# Patient Record
Sex: Male | Born: 1985 | Race: Black or African American | Hispanic: No | Marital: Married | Smoking: Never smoker
Health system: Southern US, Community
[De-identification: ages and names within clinical notes are randomized; demographics above are authoritative.]

## PROBLEM LIST (undated history)

## (undated) DIAGNOSIS — A048 Other specified bacterial intestinal infections: Secondary | ICD-10-CM

## (undated) DIAGNOSIS — M238X9 Other internal derangements of unspecified knee: Secondary | ICD-10-CM

## (undated) DIAGNOSIS — S83519A Sprain of anterior cruciate ligament of unspecified knee, initial encounter: Secondary | ICD-10-CM

## (undated) DIAGNOSIS — S83529A Sprain of posterior cruciate ligament of unspecified knee, initial encounter: Secondary | ICD-10-CM

## (undated) HISTORY — DX: Other specified bacterial intestinal infections: A04.8

## (undated) HISTORY — DX: Sprain of posterior cruciate ligament of unspecified knee, initial encounter: S83.529A

## (undated) HISTORY — DX: Other internal derangements of unspecified knee: M23.8X9

## (undated) HISTORY — DX: Sprain of anterior cruciate ligament of unspecified knee, initial encounter: S83.519A

---
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
## 2005-11-20 ENCOUNTER — Emergency Department (HOSPITAL_COMMUNITY): Admission: EM | Admit: 2005-11-20 | Discharge: 2005-11-20 | Payer: Self-pay | Admitting: Emergency Medicine

## 2005-11-20 ENCOUNTER — Ambulatory Visit (HOSPITAL_COMMUNITY): Admission: RE | Admit: 2005-11-20 | Discharge: 2005-11-20 | Payer: Self-pay | Admitting: Preventative Medicine

## 2007-08-27 ENCOUNTER — Emergency Department (HOSPITAL_COMMUNITY): Admission: EM | Admit: 2007-08-27 | Discharge: 2007-08-28 | Payer: Self-pay | Admitting: Emergency Medicine

## 2010-06-30 ENCOUNTER — Emergency Department (HOSPITAL_BASED_OUTPATIENT_CLINIC_OR_DEPARTMENT_OTHER): Admission: EM | Admit: 2010-06-30 | Discharge: 2010-06-30 | Payer: Self-pay | Admitting: Emergency Medicine

## 2010-06-30 ENCOUNTER — Ambulatory Visit: Payer: Self-pay | Admitting: Diagnostic Radiology

## 2011-09-13 ENCOUNTER — Ambulatory Visit (INDEPENDENT_AMBULATORY_CARE_PROVIDER_SITE_OTHER): Payer: Managed Care, Other (non HMO)

## 2011-09-13 DIAGNOSIS — J029 Acute pharyngitis, unspecified: Secondary | ICD-10-CM

## 2011-09-13 DIAGNOSIS — J040 Acute laryngitis: Secondary | ICD-10-CM

## 2011-10-17 ENCOUNTER — Encounter: Payer: Self-pay | Admitting: Emergency Medicine

## 2011-11-14 ENCOUNTER — Ambulatory Visit (INDEPENDENT_AMBULATORY_CARE_PROVIDER_SITE_OTHER): Payer: Managed Care, Other (non HMO) | Admitting: Family Medicine

## 2011-11-14 ENCOUNTER — Ambulatory Visit: Payer: Managed Care, Other (non HMO)

## 2011-11-14 VITALS — BP 124/77 | HR 74 | Temp 98.0°F | Resp 16 | Ht 72.0 in | Wt 264.0 lb

## 2011-11-14 DIAGNOSIS — M25562 Pain in left knee: Secondary | ICD-10-CM

## 2011-11-14 DIAGNOSIS — IMO0002 Reserved for concepts with insufficient information to code with codable children: Secondary | ICD-10-CM

## 2011-11-14 MED ORDER — NAPROXEN 500 MG PO TABS: 500.0000 mg | ORAL_TABLET | Freq: Two times a day (BID) | ORAL | Status: DC

## 2011-11-14 NOTE — Progress Notes (Signed)
  [facility]:  Office Visit  Chief Complaint:  Chief Complaint  Patient presents with  . Knee Pain    left x 1 week, nki    HPI: Corey Howell. is a 26 y.o. male who complains of  1 week h/o left knee pain with weightbearing with stairs going up and down. 5-10/10 sharp pain when it occurs. Tried ice and NSAIDs without relief. No injury. + buckling. Denies clincking, popping.He is athletic and has been doing cross training. Denies abx or steroid use.    Past Medical History  Diagnosis Date  . Unstable MCL   . Tear of PCL (posterior cruciate ligament) of knee   . ACL injury tear   . H. pylori infection    Past Surgical History  Procedure Date  . Right knee     Delbert Harness  2003-PCL, ACL, MCL, LCL repair   History   Social History  . Marital Status: Single    Spouse Name: N/A    Number of Children: N/A  . Years of Education: N/A   Social History Main Topics  . Smoking status: Never Smoker   . Smokeless tobacco: None  . Alcohol Use: No  . Drug Use: No  . Sexually Active: None   Other Topics Concern  . None   Social History Narrative  . None   Family History  Problem Relation Age of Onset  . Diabetes Mother   . Hypertension Father    No Known Allergies Prior to Admission medications   Not on File     ROS: The patient denies fevers, chills, night sweats, unintentional weight loss, chest pain, palpitations, wheezing, dyspnea on exertion, nausea, vomiting, abdominal pain, dysuria, hematuria, melena, numbness, weakness, or tingling. + knee pain  All other systems have been reviewed and were otherwise negative with the exception of those mentioned in the HPI and as above.    PHYSICAL EXAM: Filed Vitals:   11/14/11 1249  BP: 124/77  Pulse: 74  Temp: 98 F (36.7 C)  Resp: 16   Filed Vitals:   11/14/11 1249  Height: 6' (1.829 m)  Weight: 264 lb (119.75 kg)   Body mass index is 35.80 kg/(m^2).  General: Alert, no acute  distress HEENT:  Normocephalic, atraumatic, oropharynx patent.  Cardiovascular:  Regular rate and rhythm, no rubs murmurs or gallops.  No Carotid bruits, radial pulse intact. No pedal edema.  Respiratory: Clear to auscultation bilaterally.  No wheezes, rales, or rhonchi.  No cyanosis, no use of accessory musculature GI: No organomegaly, abdomen is soft and non-tender, positive bowel sounds.  No masses. Skin: No rashes. Neurologic: Facial musculature symmetric. Psychiatric: Patient is appropriate throughout our interaction. Lymphatic: No cervical lymphadenopathy Musculoskeletal: Gait intact. Left knee: no erythema, swelling, full PROM/AROM, 5/5 strength, sensation intact, 2/2 DTR knee and ankle, no crepitus, + MCL and LCL tenderness, negative  McMurray, negative  Lachman   LABS: No results found for this or any previous visit.   EKG/XRAY:   Primary read interpreted by Dr. Conley Rolls at [facility]. Normal Left knee   ASSESSMENT/PLAN: Encounter Diagnoses  Name Primary?  . Knee pain, left Yes  . Knee sprain and strain     1. Knee Brace 2. No work restrictions, decrease running, squatting until pain decreases, advance activity as tolerated 3. Naproxen BID 4. Tylenol prn   Richerd Grime PHUONG, DO 11/14/2011 1:54 PM

## 2011-11-14 NOTE — Progress Notes (Deleted)
  Subjective:    Patient ID: Corey Howell., male    DOB: [birth year], 26 y.o.   MRN: [id number]  HPI    Review of Systems     Objective:   Physical Exam        Assessment & Plan:

## 2011-12-28 ENCOUNTER — Ambulatory Visit (INDEPENDENT_AMBULATORY_CARE_PROVIDER_SITE_OTHER): Payer: Managed Care, Other (non HMO) | Admitting: Internal Medicine

## 2011-12-28 VITALS — BP 125/75 | HR 67 | Temp 97.9°F | Resp 16 | Ht 74.0 in | Wt 265.0 lb

## 2011-12-28 DIAGNOSIS — J019 Acute sinusitis, unspecified: Secondary | ICD-10-CM

## 2011-12-28 DIAGNOSIS — J309 Allergic rhinitis, unspecified: Secondary | ICD-10-CM

## 2011-12-28 MED ORDER — AMOXICILLIN 500 MG PO CAPS: 1000.0000 mg | ORAL_CAPSULE | Freq: Three times a day (TID) | ORAL | Status: AC

## 2011-12-28 MED ORDER — FLUTICASONE PROPIONATE 50 MCG/ACT NA SUSP: 2.0000 | Freq: Every day | NASAL | Status: DC

## 2011-12-28 NOTE — Progress Notes (Signed)
  Subjective:    Patient ID: Corey Howell., male    DOB: September 05, 1985, 26 y.o.   MRN: [id number]  HPICongestive x3-4 weeks with sneezing and eyes watering Increased cough particularly at night for 5 days Headache with pressure behind his and purulent nasal discharge x2 days No fever   has his first baby on the way in 10 day Review of Systems     Objective:   Physical Exam TMs clear Sinuses tender to percussion Purulent nasal discharge Throat clear Lungs clear       Assessment & Plan:   Acute sinusitis Secondary to allergic rhinitis Amoxicillin 1 g twice a day for 10 days Flonase 2 sprays each nostril at bedtime for one month

## 2012-03-28 ENCOUNTER — Ambulatory Visit: Payer: Managed Care, Other (non HMO)

## 2012-03-28 ENCOUNTER — Ambulatory Visit (INDEPENDENT_AMBULATORY_CARE_PROVIDER_SITE_OTHER): Payer: Managed Care, Other (non HMO) | Admitting: Family Medicine

## 2012-03-28 VITALS — BP 120/70 | HR 69 | Temp 98.2°F | Resp 16 | Ht 72.5 in | Wt 260.0 lb

## 2012-03-28 DIAGNOSIS — M79606 Pain in leg, unspecified: Secondary | ICD-10-CM

## 2012-03-28 DIAGNOSIS — M79609 Pain in unspecified limb: Secondary | ICD-10-CM

## 2012-03-28 MED ORDER — MELOXICAM 7.5 MG PO TABS: 7.5000 mg | ORAL_TABLET | Freq: Every day | ORAL | Status: DC

## 2012-03-28 NOTE — Progress Notes (Signed)
26 yo married man with 67 month old girl comes in with left leg pain and swelling after trauma on Thursday, 3 days ago, when struck by pallet jack at [employer]   Objective:  Left lateral lower leg just above the ankle is swollen Patient able to bear weight Good foot ROM, including dorsiflexion and eversion [facility] reading (PRIMARY) by  Dr. Milus Glazier:  Neg for fx.    Assessment:  Contusion left lower leg without neuro-vasc complication  Plan:  ACE meloxicam

## 2012-08-15 ENCOUNTER — Ambulatory Visit (INDEPENDENT_AMBULATORY_CARE_PROVIDER_SITE_OTHER): Payer: Managed Care, Other (non HMO) | Admitting: Emergency Medicine

## 2012-08-15 VITALS — BP 125/80 | HR 76 | Temp 98.7°F | Resp 16 | Ht 73.5 in | Wt 278.0 lb

## 2012-08-15 DIAGNOSIS — R05 Cough: Secondary | ICD-10-CM

## 2012-08-15 DIAGNOSIS — J209 Acute bronchitis, unspecified: Secondary | ICD-10-CM

## 2012-08-15 LAB — POCT INFLUENZA A/B
Influenza A, POC: NEGATIVE
Influenza B, POC: NEGATIVE

## 2012-08-15 MED ORDER — HYDROCOD POLST-CHLORPHEN POLST 10-8 MG/5ML PO LQCR: 5.0000 mL | Freq: Two times a day (BID) | ORAL | Status: DC | PRN

## 2012-08-15 MED ORDER — AZITHROMYCIN 250 MG PO TABS: ORAL_TABLET | ORAL | Status: DC

## 2012-08-15 MED ORDER — PSEUDOEPHEDRINE-GUAIFENESIN ER 60-600 MG PO TB12: 1.0000 | ORAL_TABLET | Freq: Two times a day (BID) | ORAL | Status: DC

## 2012-08-15 NOTE — Progress Notes (Signed)
[facility] [address], [place] [address] [phone]  Date:  08/15/2012   Name:  Corey Howell.   DOB:  [birth year]   MRN:  [id number]  PCP:  No primary provider on file.    Chief Complaint: Sore Throat, Cough and Nausea   History of Present Illness:  Corey Lamp. is a 26 y.o. very pleasant male patient who presents with the following:  Sudden onset of illness Thursday with sore throat, nasal congestion and cough productive mucoid sputum.  No wheezing or shortness of breath.  No nausea or vomiting.  No improvement with OTC medication.  No fever or chills.  Has malaise.  Sick kids at home.  No flu shot  There is no problem list on file for this patient.   Past Medical History  Diagnosis Date  . Unstable MCL   . Tear of PCL (posterior cruciate ligament) of knee   . ACL injury tear   . H. pylori infection     Past Surgical History  Procedure Date  . Right knee     Delbert Harness  2003-PCL, ACL, MCL, LCL repair  . Joint replacement     History  Substance Use Topics  . Smoking status: Never Smoker   . Smokeless tobacco: Not on file  . Alcohol Use: Yes    Family History  Problem Relation Age of Onset  . Diabetes Mother   . Hypertension Father     No Known Allergies  Medication list has been reviewed and updated.  Current Outpatient Prescriptions on File Prior to Visit  Medication Sig Dispense Refill  . fluticasone (FLONASE) 50 MCG/ACT nasal spray Place 2 sprays into the nose daily.  16 g  6  . meloxicam (MOBIC) 7.5 MG tablet Take 1 tablet (7.5 mg total) by mouth daily.  10 tablet  0  . naproxen (NAPROSYN) 500 MG tablet Take 1 tablet (500 mg total) by mouth 2 (two) times daily with a meal.  60 tablet  1    Review of Systems:  As per HPI, otherwise negative.    Physical Examination: Filed Vitals:   08/15/12 0818  BP: 125/80  Pulse: 76  Temp: 98.7 F (37.1 C)  Resp: 16   Filed Vitals:   08/15/12 0818  Height: 6'  1.5" (1.867 m)  Weight: 278 lb (126.1 kg)   Body mass index is 36.18 kg/(m^2). Ideal Body Weight: Weight in (lb) to have BMI = 25: 191.7   GEN: WDWN, NAD, Non-toxic, A & O x 3 HEENT: Atraumatic, Normocephalic. Neck supple. No masses, No LAD. Ears and Nose: No external deformity. CV: RRR, No M/G/R. No JVD. No thrill. No extra heart sounds. PULM: CTA B, no wheezes, crackles, rhonchi. No retractions. No resp. distress. No accessory muscle use. ABD: S, NT, ND, +BS. No rebound. No HSM. EXTR: No c/c/e NEURO Normal gait.  PSYCH: Normally interactive. Conversant. Not depressed or anxious appearing.  Calm demeanor.    Assessment and Plan: Bronchitis tussionex  mucinex zpak  Carmelina Dane, MD Results for orders placed in visit on 08/15/12  POCT INFLUENZA A/B      Component Value Range   Influenza A, POC Negative     Influenza B, POC Negative

## 2012-08-15 NOTE — Patient Instructions (Addendum)

## 2012-09-18 ENCOUNTER — Encounter (HOSPITAL_BASED_OUTPATIENT_CLINIC_OR_DEPARTMENT_OTHER): Payer: Self-pay | Admitting: Emergency Medicine

## 2012-09-18 ENCOUNTER — Emergency Department (HOSPITAL_BASED_OUTPATIENT_CLINIC_OR_DEPARTMENT_OTHER): Payer: Worker's Compensation

## 2012-09-18 ENCOUNTER — Emergency Department (HOSPITAL_BASED_OUTPATIENT_CLINIC_OR_DEPARTMENT_OTHER)
Admission: EM | Admit: 2012-09-18 | Discharge: 2012-09-18 | Disposition: A | Discharge status: Home / Self Care | Payer: Worker's Compensation | Attending: Emergency Medicine | Admitting: Emergency Medicine

## 2012-09-18 DIAGNOSIS — Z87828 Personal history of other (healed) physical injury and trauma: Secondary | ICD-10-CM | POA: Insufficient documentation

## 2012-09-18 DIAGNOSIS — Y929 Unspecified place or not applicable: Secondary | ICD-10-CM | POA: Insufficient documentation

## 2012-09-18 DIAGNOSIS — S93409A Sprain of unspecified ligament of unspecified ankle, initial encounter: Secondary | ICD-10-CM

## 2012-09-18 DIAGNOSIS — X500XXA Overexertion from strenuous movement or load, initial encounter: Secondary | ICD-10-CM | POA: Insufficient documentation

## 2012-09-18 DIAGNOSIS — Y939 Activity, unspecified: Secondary | ICD-10-CM | POA: Insufficient documentation

## 2012-09-18 DIAGNOSIS — Z8619 Personal history of other infectious and parasitic diseases: Secondary | ICD-10-CM | POA: Insufficient documentation

## 2012-09-18 MED ORDER — NAPROXEN 500 MG PO TABS: 500.0000 mg | ORAL_TABLET | Freq: Two times a day (BID) | ORAL | Status: DC

## 2012-09-18 NOTE — ED Notes (Signed)
Pt slipped and twisted his left ankle.  C/o pain to same.  Able to bear weight.

## 2012-09-18 NOTE — ED Provider Notes (Signed)
History     SN: [id number] Arrival date & time 09/18/12  1218 First MD Initiated Contact with Patient 09/18/12 1226      Chief Complaint  Patient presents with  . Ankle Injury    Patient is a 27 y.o. male presenting with lower extremity injury. The history is provided by the patient.  Ankle Injury This is a new problem. The current episode started yesterday. The problem occurs constantly. The problem has not changed since onset.Pertinent negatives include no abdominal pain and no headaches. Associated symptoms comments: No numbness or weakness. The symptoms are aggravated by walking. The symptoms are relieved by rest.    Past Medical History  Diagnosis Date  . Unstable MCL   . Tear of PCL (posterior cruciate ligament) of knee   . ACL injury tear   . H. pylori infection     Past Surgical History  Procedure Date  . Right knee     Delbert Harness  2003-PCL, ACL, MCL, LCL repair  . Joint replacement     Family History  Problem Relation Age of Onset  . Diabetes Mother   . Hypertension Father     History  Substance Use Topics  . Smoking status: Never Smoker   . Smokeless tobacco: Not on file  . Alcohol Use: Yes      Review of Systems  Gastrointestinal: Negative for abdominal pain.  Neurological: Negative for headaches.  All other systems reviewed and are negative.    Allergies  Review of patient's allergies indicates no known allergies.  Home Medications  No current outpatient prescriptions on file.  There were no vitals taken for this visit.  Physical Exam  Nursing note and vitals reviewed. Constitutional: He appears well-developed and well-nourished. No distress.  HENT:  Head: Normocephalic and atraumatic.  Right Ear: External ear normal.  Left Ear: External ear normal.  Eyes: Conjunctivae normal are normal. Right eye exhibits no discharge. Left eye exhibits no discharge. No scleral icterus.  Neck: Neck supple. No tracheal deviation present.   Cardiovascular: Normal rate.   Pulmonary/Chest: Effort normal. No stridor. No respiratory distress.  Musculoskeletal: He exhibits no edema.       Left ankle: He exhibits decreased range of motion, swelling and deformity. He exhibits no ecchymosis, no laceration and normal pulse. tenderness. Lateral malleolus tenderness found. No medial malleolus, no head of 5th metatarsal and no proximal fibula tenderness found. Achilles tendon normal.  Neurological: He is alert. Cranial nerve deficit: no gross deficits.  Skin: Skin is warm and dry. No rash noted.  Psychiatric: He has a normal mood and affect.    ED Course  Procedures (including critical care time)  Labs Reviewed - No data to display Dg Ankle Complete Left  09/18/2012  *RADIOLOGY REPORT*  Clinical Data: Twisted left ankle, now with generalized ankle pain  LEFT ANKLE COMPLETE - 3+ VIEW  Comparison: Left tibia and fibula - 03/18/2012  Findings:  There is mild diffuse soft tissue swelling about the anterolateral aspect of the ankle without associated fracture or dislocation. Query small ankle joint effusion.  Incidental note is made of a prominent os trigonum.  No radiopaque foreign body.  IMPRESSION: Soft tissue swelling about the anterolateral aspect of the ankle without associated fracture or dislocation.   Original Report Authenticated By: Tacey Ruiz, MD      1. Ankle sprain       MDM  C/w ankle sprain.  No sign of fracture.  ASO splint. Refer to sports med  Celene Kras, MD 09/18/12 [phone]

## 2012-11-29 ENCOUNTER — Ambulatory Visit (INDEPENDENT_AMBULATORY_CARE_PROVIDER_SITE_OTHER): Payer: Managed Care, Other (non HMO) | Admitting: Physician Assistant

## 2012-11-29 VITALS — BP 132/84 | HR 70 | Temp 98.0°F | Resp 18 | Ht 72.0 in | Wt 260.0 lb

## 2012-11-29 DIAGNOSIS — J029 Acute pharyngitis, unspecified: Secondary | ICD-10-CM

## 2012-11-29 DIAGNOSIS — R05 Cough: Secondary | ICD-10-CM

## 2012-11-29 MED ORDER — BENZONATATE 100 MG PO CAPS: 100.0000 mg | ORAL_CAPSULE | Freq: Three times a day (TID) | ORAL | Status: DC | PRN

## 2012-11-29 MED ORDER — IPRATROPIUM BROMIDE 0.03 % NA SOLN: 2.0000 | Freq: Two times a day (BID) | NASAL | Status: DC

## 2012-11-29 MED ORDER — GUAIFENESIN ER 1200 MG PO TB12: 1.0000 | ORAL_TABLET | Freq: Two times a day (BID) | ORAL | Status: DC | PRN

## 2012-11-29 NOTE — Patient Instructions (Signed)
Get plenty of rest and drink at least 64 ounces of water daily. 

## 2012-11-29 NOTE — Progress Notes (Signed)
  Subjective:    Patient ID: Corey Redden., male    DOB: [birth year], 27 y.o.   MRN: [id number]  HPI  This 27 y.o. male presents for evaluation of illness.  Has had a sore throat for about a week.  Then developed cough about 3 days ago.  Cough is productive of green phlegm.  No nasal congestion or post-nasal drainage.  Temp 99 yesterday.  No chills.  No unexplained myalgias/arthralgia.  Diarrhea yesterday.  No nausea or vomiting.   Past Medical History  Diagnosis Date  . Unstable MCL   . Tear of PCL (posterior cruciate ligament) of knee   . ACL injury tear   . H. pylori infection     Past Surgical History  Procedure Laterality Date  . Knee reconstruction Right 2003    Sheryle Spray, ACL, MCL, LCL repair    Prior to Admission medications   Not on File    No Known Allergies  History   Social History  . Marital Status: Married    Spouse Name: Corey Howell    Number of Children: 1  . Years of Education: 12   Occupational History  . grocery selector [employer]   Social History Main Topics  . Smoking status: Never Smoker   . Smokeless tobacco: Never Used  . Alcohol Use: 1.2 oz/week    2 Glasses of wine per week  . Drug Use: No  . Sexually Active: Yes -- Male partner(s)    Birth Control/ Protection: Condom   Other Topics Concern  . Not on file   Social History Narrative   Lives with his wife and their daughter.    Family History  Problem Relation Age of Onset  . Diabetes Mother   . Hypertension Father     Review of Systems As above. No chest pain, SOB, HA, dizziness, vision change, dysuria, urinary urgency or frequency, or rash.     Objective:   Physical Exam  Blood pressure 132/84, pulse 70, temperature 98 F (36.7 C), temperature source Oral, resp. rate 18, height 6' (1.829 m), weight 260 lb (117.935 kg), SpO2 99.00%. Body mass index is 35.25 kg/(m^2). Well-developed, well nourished BM who is awake, alert and oriented, in NAD. HEENT: [place]/AT,  PERRL, EOMI.  Sclera and conjunctiva are clear.  EAC are patent, TMs are normal in appearance. Nasal mucosa is pink and moist. OP is clear. Neck: supple, non-tender, no lymphadenopathy, thyromegaly. Heart: RRR, no murmur Lungs: normal effort, CTA Extremities: no cyanosis, clubbing or edema. Skin: warm and dry without rash. Psychologic: good mood and appropriate affect, normal speech and behavior.     Assessment & Plan:  Cough - Plan: benzonatate (TESSALON) 100 MG capsule  Acute pharyngitis - Plan: ipratropium (ATROVENT) 0.03 % nasal spray, Guaifenesin (MUCINEX MAXIMUM STRENGTH) 1200 MG TB12  Supportive care.  Anticipatory guidance. RTC if symptoms worsen/persist.  Fernande Bras, PA-C Certified Physician Assistant [facility]

## 2013-01-17 ENCOUNTER — Ambulatory Visit (INDEPENDENT_AMBULATORY_CARE_PROVIDER_SITE_OTHER): Payer: Managed Care, Other (non HMO) | Admitting: Physician Assistant

## 2013-01-17 VITALS — BP 144/79 | HR 76 | Temp 98.5°F | Resp 16 | Ht 72.5 in | Wt 252.0 lb

## 2013-01-17 DIAGNOSIS — R112 Nausea with vomiting, unspecified: Secondary | ICD-10-CM

## 2013-01-17 DIAGNOSIS — R5381 Other malaise: Secondary | ICD-10-CM

## 2013-01-17 DIAGNOSIS — R5383 Other fatigue: Secondary | ICD-10-CM

## 2013-01-17 LAB — POCT CBC
Granulocyte percent: 69.8 %G (ref 37–80)
HCT, POC: 47.6 % (ref 43.5–53.7)
Lymph, poc: 1.3 (ref 0.6–3.4)
MCHC: 31.7 g/dL — AB (ref 31.8–35.4)
MPV: 12 fL (ref 0–99.8)
POC Granulocyte: 3.6 (ref 2–6.9)
POC LYMPH PERCENT: 24.3 %L (ref 10–50)
POC MID %: 5.9 %M (ref 0–12)
Platelet Count, POC: 200 10*3/uL (ref 142–424)
RDW, POC: 15.5 %

## 2013-01-17 MED ORDER — PROMETHAZINE HCL 12.5 MG PO TABS: 12.5000 mg | ORAL_TABLET | Freq: Three times a day (TID) | ORAL | Status: DC | PRN

## 2013-01-17 MED ORDER — ONDANSETRON 4 MG PO TBDP: 8.0000 mg | ORAL_TABLET | Freq: Once | ORAL | Status: DC

## 2013-01-17 NOTE — Progress Notes (Signed)
  Subjective:    Patient ID: Corey Howell., male    DOB: 03-10-1986, 27 y.o.   MRN: [id number]  HPI  Corey Howell, 27 year old male with no significant past medical history, presents with a CC of nausea, vomiting, diarrhea, abdominal cramping x 1 day. Patient states when he woke up he felt nauseous but went to work anyway. While he continued to work he felt nauseous and vomited. Vomiting continued intermittently x 5 times yesterday. Diarrhea x 2 times yesterday. Night sweats overnight, patient measured his temperature as 99.9 at home. Vomiting continued this morning with some "dry heaving." Abdominal pain is RUL and LUQ, cramping in nature. Unable to keep down foods, fluids, or water. Patient reports occasional dizziness when switching positions. Ibuprofen and Pepto-bismol offered only mild relief of symptoms. Sick contact at work with similar symptoms. Denies hematemesis, hematochezia, or melena. Denies sick contacts, history of abdominal surgeries. Denies: headache, fainting, SOB, chest pain, chest tightness, urinary frequency/urgency, numbness/tingling in extremities. Generally healthy.    Review of Systems As above.     Objective:   Physical Exam  General: WDWN male, appears stated age, no acute distress, not toxic appearing HEENT: normocephalic, atraumatic, PERLA, sclera/conjunctiva clear, uvula midline, no erythema of posterior palate or tonsils, no nasal discharge, TM are clear with visible bony landmarks, neck supple, no JVD, no lymphadenopathy/tenderness  Resp: clear to auscultation anterior and posterior fields bilaterally, no rales/rhonchi/wheezes Cardiac: RRR, no murmurs/rubs/gallops Abdominal: no masses, normal bowel sounds, soft, non distended, slightly tender to palpation RUQ & LUQ, no murphy's sign, no mcburney's tenderness, no rebound tenderness, negative table jar test Extremities: moves all extremities spontaneously Neuro: alert & oriented x3, cranial nerves II-XII grossly  intact, normal affect Skin: no rashes, lesions, or jaundice  Results for orders placed in visit on 01/17/13  POCT CBC      Result Value Range   WBC 5.2  4.6 - 10.2 K/uL   Lymph, poc 1.3  0.6 - 3.4   POC LYMPH PERCENT 24.3  10 - 50 %L   MID (cbc) 0.3  0 - 0.9   POC MID % 5.9  0 - 12 %M   POC Granulocyte 3.6  2 - 6.9   Granulocyte percent 69.8  37 - 80 %G   RBC 5.47  4.69 - 6.13 M/uL   Hemoglobin 15.1  14.1 - 18.1 g/dL   HCT, POC 40.9  81.1 - 53.7 %   MCV 87.1  80 - 97 fL   MCH, POC 27.6  27 - 31.2 pg   MCHC 31.7 (*) 31.8 - 35.4 g/dL   RDW, POC 91.4     Platelet Count, POC 200  142 - 424 K/uL   MPV 12.0  0 - 99.8 fL       Assessment & Plan:  Nausea with vomiting - Plan: POCT CBC, ondansetron (ZOFRAN-ODT) disintegrating tablet 8 mg  Zofran 4 mg ODT in office Phenergan 12.5 mg 1 po q 8 hours prn nausea #20 RF 1 Encourage oral fluids and hydration Advance diet as tolerated Return if symptoms worsen or persist for 24 hours.   Out of work note for today Not orthostatic   Patient seen, discussed, and examined by Eula Listen, PA-C.

## 2013-03-06 ENCOUNTER — Ambulatory Visit: Payer: Managed Care, Other (non HMO)

## 2013-03-06 ENCOUNTER — Ambulatory Visit (INDEPENDENT_AMBULATORY_CARE_PROVIDER_SITE_OTHER): Payer: Managed Care, Other (non HMO) | Admitting: Family Medicine

## 2013-03-06 VITALS — BP 128/74 | HR 72 | Temp 98.0°F | Resp 16 | Ht 72.5 in | Wt 251.2 lb

## 2013-03-06 DIAGNOSIS — R252 Cramp and spasm: Secondary | ICD-10-CM

## 2013-03-06 DIAGNOSIS — M549 Dorsalgia, unspecified: Secondary | ICD-10-CM

## 2013-03-06 LAB — POCT CBC
Granulocyte percent: 56.9 %G (ref 37–80)
HCT, POC: 44.5 % (ref 43.5–53.7)
Hemoglobin: 14.4 g/dL (ref 14.1–18.1)
Lymph, poc: 1.7 (ref 0.6–3.4)
MCH, POC: 27.7 pg (ref 27–31.2)
MCHC: 32.4 g/dL (ref 31.8–35.4)
MCV: 85.5 fL (ref 80–97)
MID (cbc): 0.4 (ref 0–0.9)
MPV: 11.3 fL (ref 0–99.8)
POC Granulocyte: 2.8 (ref 2–6.9)
POC LYMPH PERCENT: 35.7 %L (ref 10–50)
POC MID %: 7.4 % (ref 0–12)
Platelet Count, POC: 253 10*3/uL (ref 142–424)
RBC: 5.2 M/uL (ref 4.69–6.13)
RDW, POC: 14.5 %
WBC: 4.9 10*3/uL (ref 4.6–10.2)

## 2013-03-06 LAB — POCT UA - MICROSCOPIC ONLY
Bacteria, U Microscopic: NEGATIVE
Casts, Ur, LPF, POC: NEGATIVE
Crystals, Ur, HPF, POC: NEGATIVE
Epithelial cells, urine per micros: NEGATIVE
Mucus, UA: NEGATIVE
RBC, urine, microscopic: NEGATIVE
Yeast, UA: NEGATIVE

## 2013-03-06 LAB — POCT URINALYSIS DIPSTICK
Bilirubin, UA: NEGATIVE
Glucose, UA: NEGATIVE
Ketones, UA: NEGATIVE
Leukocytes, UA: NEGATIVE
Nitrite, UA: NEGATIVE
Protein, UA: NEGATIVE
Spec Grav, UA: 1.02
Urobilinogen, UA: 0.2
pH, UA: 6

## 2013-03-06 LAB — COMPREHENSIVE METABOLIC PANEL
Albumin: 4.5 g/dL (ref 3.5–5.2)
Alkaline Phosphatase: 89 U/L (ref 39–117)
BUN: 13 mg/dL (ref 6–23)
Calcium: 9.9 mg/dL (ref 8.4–10.5)
Chloride: 102 mEq/L (ref 96–112)
Creat: 1.07 mg/dL (ref 0.50–1.35)
Glucose, Bld: 91 mg/dL (ref 70–99)
Potassium: 4.7 mEq/L (ref 3.5–5.3)

## 2013-03-06 LAB — COMPREHENSIVE METABOLIC PANEL WITH GFR
ALT: 50 U/L (ref 0–53)
AST: 48 U/L — ABNORMAL HIGH (ref 0–37)
CO2: 25 meq/L (ref 19–32)
Sodium: 137 meq/L (ref 135–145)
Total Bilirubin: 1.1 mg/dL (ref 0.3–1.2)
Total Protein: 7.4 g/dL (ref 6.0–8.3)

## 2013-03-06 LAB — MAGNESIUM: Magnesium: 2.1 mg/dL (ref 1.5–2.5)

## 2013-03-06 LAB — CK: Total CK: 1728 U/L — ABNORMAL HIGH (ref 7–232)

## 2013-03-06 NOTE — Patient Instructions (Addendum)
Ureteral Colic (Kidney Stones)  Ureteral colic is the result of a condition when kidney stones form inside the kidney. Once kidney stones are formed they may move into the tube that connects the kidney with the bladder (ureter). If this occurs, this condition may cause pain (colic) in the ureter.   CAUSES   Pain is caused by stone movement in the ureter and the obstruction caused by the stone.  SYMPTOMS   The pain comes and goes as the ureter contracts around the stone. The pain is usually intense, sharp, and stabbing in character. The location of the pain may move as the stone moves through the ureter. When the stone is near the kidney the pain is usually located in the back and radiates to the belly (abdomen). When the stone is ready to pass into the bladder the pain is often located in the lower abdomen on the side the stone is located. At this location, the symptoms may mimic those of a urinary tract infection with urinary frequency. Once the stone is located here it often passes into the bladder and the pain disappears completely.  TREATMENT    Your caregiver will provide you with medicine for pain relief.   You may require specialized follow-up X-rays.   The absence of pain does not always mean that the stone has passed. It may have just stopped moving. If the urine remains completely obstructed, it can cause loss of kidney function or even complete destruction of the involved kidney. It is your responsibility and in your interest that X-rays and follow-ups as suggested by your caregiver are completed. Relief of pain without passage of the stone can be associated with severe damage to the kidney, including loss of kidney function on that side.   If your stone does not pass on its own, additional measures may be taken by your caregiver to ensure its removal.  HOME CARE INSTRUCTIONS    Increase your fluid intake. Water is the preferred fluid since juices containing vitamin C may acidify the urine making it  less likely for certain stones (uric acid stones) to pass.   Strain all urine. A strainer will be provided. Keep all particulate matter or stones for your caregiver to inspect.   Take your pain medicine as directed.   Make a follow-up appointment with your caregiver as directed.   Remember that the goal is passage of your stone. The absence of pain does not mean the stone is gone. Follow your caregiver's instructions.   Only take over-the-counter or prescription medicines for pain, discomfort, or fever as directed by your caregiver.  SEEK MEDICAL CARE IF:    Pain cannot be controlled with the prescribed medicine.   You have a fever.   Pain continues for longer than your caregiver advises it should.   There is a change in the pain, and you develop chest discomfort or constant abdominal pain.   You feel faint or pass out.  MAKE SURE YOU:    Understand these instructions.   Will watch your condition.   Will get help right away if you are not doing well or get worse.  Document Released: 05/14/2005 Document Revised: 10/27/2011 Document Reviewed: 01/29/2011  [employer] Patient Information 2014 [employer]

## 2013-03-06 NOTE — Progress Notes (Signed)
[facility]:  Office Visit  Chief Complaint:  Chief Complaint  Patient presents with  . Back Pain    cramps in Rt side of back x yesterday -[facility]-    HPI: Corey Howell. is a 27 y.o. male who complains of  Cramping 12-13 hrs since yesterday AM on the right side of his back, does not matter what position he is in, when he stretches it would radiate to left side and then back to right. Tried heat, massage, ibuprofen ( took 4 without relief). Similar to prior cramping but worse, usually 2-3 hrs and stretch out ok but he is here today because it did not stop on the right side until this morning. He currently does not have pain. He takes creatine and protein shakes. He exercise regular but is not extremely aggressive. Has had prior back injuries but 2 years ago had pulled his back x 2 No urinary sxs except increase frequency, no incontinence, no hematuria Denies numbness or tingling 10/10 Sharp pain, could not move and was sweating profusely when this happened Then he has cramping in his abdomen and his arms, This was the first time the cramping in his abdomen occurred.  When he lifts at work usually about 40 lbs However when he goes to the gym , he uses weights and he usually lifts about 200lbs. He did Dead lifts on 01-08-2023, this particular episode started yesterday ( Saturday).  On Friday 185 lbs deadweights and butterfly lifts.   Denies any family hisotry of muscular dystrophy or rhabdomyolysis Past Medical History  Diagnosis Date  . Unstable MCL   . Tear of PCL (posterior cruciate ligament) of knee   . ACL injury tear   . H. pylori infection    Past Surgical History  Procedure Laterality Date  . Knee reconstruction Right 2003    Sheryle Spray, ACL, MCL, LCL repair   History   Social History  . Marital Status: Married    Spouse Name: Lavonna Rua    Number of Children: 1  . Years of Education: 12   Occupational History  . grocery selector [employer]    Social History Main Topics  . Smoking status: Never Smoker   . Smokeless tobacco: Never Used  . Alcohol Use: 1.2 oz/week    2 Glasses of wine per week  . Drug Use: No  . Sexually Active: Yes -- Male partner(s)    Birth Control/ Protection: Condom   Other Topics Concern  . None   Social History Narrative   Lives with his wife and their daughter.   Family History  Problem Relation Age of Onset  . Diabetes Mother   . Hypertension Father    No Known Allergies Prior to Admission medications   Medication Sig Start Date End Date Taking? Authorizing Provider  ibuprofen (ADVIL,MOTRIN) 200 MG tablet Take 200 mg by mouth every 6 (six) hours as needed for pain.   Yes Historical Provider, MD  benzonatate (TESSALON) 100 MG capsule Take 1-2 capsules (100-200 mg total) by mouth 3 (three) times daily as needed for cough. 11/29/12   Chelle S Jeffery, PA-C  bismuth subsalicylate (PEPTO BISMOL) 262 MG/15ML suspension Take 15 mLs by mouth every 6 (six) hours as needed for indigestion.    Historical Provider, MD  Guaifenesin (MUCINEX MAXIMUM STRENGTH) 1200 MG TB12 Take 1 tablet (1,200 mg total) by mouth every 12 (twelve) hours as needed. 11/29/12   Chelle S Jeffery, PA-C  ipratropium (ATROVENT) 0.03 % nasal  spray Place 2 sprays into the nose 2 (two) times daily. 11/29/12   Chelle S Jeffery, PA-C  promethazine (PHENERGAN) 12.5 MG tablet Take 1 tablet (12.5 mg total) by mouth every 8 (eight) hours as needed for nausea. 01/17/13   Sondra Barges, PA-C     ROS: The patient denies fevers, chills, night sweats, unintentional weight loss, chest pain, palpitations, wheezing, dyspnea on exertion, nausea, vomiting, abdominal pain, dysuria, hematuria, melena, numbness, weakness, or tingling.  All other systems have been reviewed and were otherwise negative with the exception of those mentioned in the HPI and as above.    PHYSICAL EXAM: Filed Vitals:   03/06/13 0808  BP: 128/74  Pulse: 72  Temp: 98 F (36.7 C)   Resp: 16   Filed Vitals:   03/06/13 0808  Height: 6' 0.5" (1.842 m)  Weight: 251 lb 3.2 oz (113.944 kg)   Body mass index is 33.58 kg/(m^2).  General: Alert, no acute distress HEENT:  Normocephalic, atraumatic, oropharynx patent.  Cardiovascular:  Regular rate and rhythm, no rubs murmurs or gallops.  No Carotid bruits, radial pulse intact. No pedal edema.  Respiratory: Clear to auscultation bilaterally.  No wheezes, rales, or rhonchi.  No cyanosis, no use of accessory musculature GI: No organomegaly, abdomen is soft and non-tender, positive bowel sounds.  No masses. Skin: No rashes. Neurologic: Facial musculature symmetric. Psychiatric: Patient is appropriate throughout our interaction. Lymphatic: No cervical lymphadenopathy Musculoskeletal: Gait intact. Full ROM, 5/5 strength Nontender along C through L spine Able to sqaut without difficulties.  No msk tenderness in arms or calf   LABS: Results for orders placed in visit on 03/06/13  POCT URINALYSIS DIPSTICK      Result Value Range   Color, UA yellow     Clarity, UA clear     Glucose, UA neg     Bilirubin, UA neg     Ketones, UA neg     Spec Grav, UA 1.020     Blood, UA trace     pH, UA 6.0     Protein, UA neg     Urobilinogen, UA 0.2     Nitrite, UA neg     Leukocytes, UA Negative    POCT UA - MICROSCOPIC ONLY      Result Value Range   WBC, Ur, HPF, POC 0-5     RBC, urine, microscopic neg     Bacteria, U Microscopic neg     Mucus, UA neg     Epithelial cells, urine per micros neg     Crystals, Ur, HPF, POC neg     Casts, Ur, LPF, POC neg     Yeast, UA neg    POCT CBC      Result Value Range   WBC 4.9  4.6 - 10.2 K/uL   Lymph, poc 1.7  0.6 - 3.4   POC LYMPH PERCENT 35.7  10 - 50 %L   MID (cbc) 0.4  0 - 0.9   POC MID % 7.4  0 - 12 %M   POC Granulocyte 2.8  2 - 6.9   Granulocyte percent 56.9  37 - 80 %G   RBC 5.20  4.69 - 6.13 M/uL   Hemoglobin 14.4  14.1 - 18.1 g/dL   HCT, POC 78.2  95.6 - 53.7 %   MCV  85.5  80 - 97 fL   MCH, POC 27.7  27 - 31.2 pg   MCHC 32.4  31.8 - 35.4 g/dL   RDW,  POC 14.5     Platelet Count, POC 253  142 - 424 K/uL   MPV 11.3  0 - 99.8 fL     EKG/XRAY:   Primary read interpreted by Dr. Conley Rolls at [facility]. No fx/dislocation   ASSESSMENT/PLAN: Encounter Diagnoses  Name Primary?  . Acute back pain Yes  . Muscle cramps    ? Renal colic secondary to renal stones with also exercised induced myopathy He is asymptomatic right now Recommend ibuprofen and push fluids Stop Creatinine and protein Labs pending CMP, Mg, CK   Deniz Hannan PHUONG, DO 03/06/2013 9:11 AM

## 2013-03-07 ENCOUNTER — Telehealth: Payer: Self-pay | Admitting: Family Medicine

## 2013-03-07 NOTE — Telephone Encounter (Signed)
LM about labs. Asked to return to get CK recheck in 1 week. Stop creatine, protein and also exercise. I think it is all exercised induced and his supplements. Everything else was normal.

## 2013-04-25 ENCOUNTER — Ambulatory Visit (INDEPENDENT_AMBULATORY_CARE_PROVIDER_SITE_OTHER): Payer: Managed Care, Other (non HMO) | Admitting: Emergency Medicine

## 2013-04-25 ENCOUNTER — Ambulatory Visit: Payer: Managed Care, Other (non HMO)

## 2013-04-25 VITALS — BP 122/64 | HR 64 | Temp 98.1°F | Resp 16 | Ht 72.5 in | Wt 247.6 lb

## 2013-04-25 DIAGNOSIS — M542 Cervicalgia: Secondary | ICD-10-CM

## 2013-04-25 MED ORDER — MELOXICAM 15 MG PO TABS: 15.0000 mg | ORAL_TABLET | Freq: Every day | ORAL | Status: DC

## 2013-04-25 MED ORDER — HYDROCODONE-ACETAMINOPHEN 5-325 MG PO TABS: 1.0000 | ORAL_TABLET | Freq: Four times a day (QID) | ORAL | Status: DC | PRN

## 2013-04-25 MED ORDER — CYCLOBENZAPRINE HCL 10 MG PO TABS: ORAL_TABLET | ORAL | Status: DC

## 2013-04-25 NOTE — Patient Instructions (Addendum)

## 2013-04-25 NOTE — Progress Notes (Signed)
  Subjective:    Patient ID: Corey Howell., male    DOB: Sep 05, 1985, 27 y.o.   MRN: [id number]  HPI the patient was at the gym lifting. While having 165 pounds overhead he turned his head to the right and felt an immediate pain in the back of his neck. He has never had this type of pain before. He did not have any radicular symptoms down the right arm. He does not feel any weakness in the right arm. He has no previous history of neck problems. This was his regular routine for lifting    Review of Systems     Objective:   Physical Exam who has significant tenderness over C5. He has significant pain with turning to the right. His motor strength of the upper extremities is 5 out of 5. DTR,ssymmetrical deep tendon reflexes are 1+ and symmetrical.  [facility] reading (PRIMARY) by  Dr Cleta Alberts there are no definite abnormalities noted on C-spine films no fractures seen       Assessment & Plan:   this is cervical strain. He will be on Flexeril , meloxicam, and when necessary hydrocodone. He was given a note for work

## 2013-08-24 ENCOUNTER — Ambulatory Visit (INDEPENDENT_AMBULATORY_CARE_PROVIDER_SITE_OTHER): Payer: Managed Care, Other (non HMO) | Admitting: Family Medicine

## 2013-08-24 VITALS — BP 116/78 | HR 84 | Temp 98.7°F | Resp 16 | Ht 73.0 in | Wt 245.8 lb

## 2013-08-24 DIAGNOSIS — J3489 Other specified disorders of nose and nasal sinuses: Secondary | ICD-10-CM

## 2013-08-24 DIAGNOSIS — R0981 Nasal congestion: Secondary | ICD-10-CM

## 2013-08-24 DIAGNOSIS — R059 Cough, unspecified: Secondary | ICD-10-CM

## 2013-08-24 DIAGNOSIS — R05 Cough: Secondary | ICD-10-CM

## 2013-08-24 DIAGNOSIS — E663 Overweight: Secondary | ICD-10-CM

## 2013-08-24 DIAGNOSIS — R509 Fever, unspecified: Secondary | ICD-10-CM

## 2013-08-24 LAB — POCT INFLUENZA A/B
Influenza A, POC: NEGATIVE
Influenza B, POC: NEGATIVE

## 2013-08-24 MED ORDER — FLUTICASONE PROPIONATE 50 MCG/ACT NA SUSP: 2.0000 | Freq: Every day | NASAL | Status: DC

## 2013-08-24 MED ORDER — GUAIFENESIN ER 600 MG PO TB12: 600.0000 mg | ORAL_TABLET | Freq: Two times a day (BID) | ORAL | Status: DC | PRN

## 2013-08-24 NOTE — Progress Notes (Signed)
[facility] [address], [place] [id number] [phone]  Date:  08/24/2013   Name:  Corey ReddenMark L Illingworth Jr.   DOB:  19-Jul-1986   MRN:  [id number]  PCP:  No PCP Per Patient    Chief Complaint: Fever, Cough and Sore Throat   History of Present Illness:  Corey ReddenMark L Drohan Jr. is a 28 y.o. very pleasant male patient who presents with the following:  He is here today with 2 days of illness.   He had a cough and ST; these are now resolved, but he now has sinus congestion.   He had a temperature up to around 100.6 about 36 hours ago.   No GI symptoms.   He did have aches and chills- these are now better.    He has tried some tylenol cold prep, and theraflu.    He did take ibuprofen last night at 8pm- nothing since then.    Generally he is healthy.    There are no active problems to display for this patient.   Past Medical History  Diagnosis Date  . Unstable MCL   . Tear of PCL (posterior cruciate ligament) of knee   . ACL injury tear   . H. pylori infection     Past Surgical History  Procedure Laterality Date  . Knee reconstruction Right 2003    Sheryle SprayMurphy Wainer-PCL, ACL, MCL, LCL repair    History  Substance Use Topics  . Smoking status: Never Smoker   . Smokeless tobacco: Never Used  . Alcohol Use: 1.2 oz/week    2 Glasses of wine per week    Family History  Problem Relation Age of Onset  . Diabetes Mother   . Hypertension Father   . Hypertension Maternal Grandmother   . Diabetes Maternal Grandfather     No Known Allergies  Medication list has been reviewed and updated.  Current Outpatient Prescriptions on File Prior to Visit  Medication Sig Dispense Refill  . cyclobenzaprine (FLEXERIL) 10 MG tablet Take one tablet at bedtime as needed as a muscle relaxant  14 tablet  0  . HYDROcodone-acetaminophen (NORCO) 5-325 MG per tablet Take 1 tablet by mouth every 6 (six) hours as needed for pain.  12 tablet  0  . ibuprofen (ADVIL,MOTRIN) 200 MG  tablet Take 200 mg by mouth every 6 (six) hours as needed for pain.      . meloxicam (MOBIC) 15 MG tablet Take 1 tablet (15 mg total) by mouth daily.  30 tablet  0   No current facility-administered medications on file prior to visit.    Review of Systems:  As per HPI- otherwise negative.   Physical Examination: Filed Vitals:   08/24/13 0823  BP: 116/78  Pulse: 84  Temp: 98.7 F (37.1 C)  Resp: 16   Filed Vitals:   08/24/13 0823  Height: 6\' 1"  (1.854 m)  Weight: 245 lb 12.8 oz (111.494 kg)   Body mass index is 32.44 kg/(m^2). Ideal Body Weight: Weight in (lb) to have BMI = 25: 189.1  GEN: WDWN, NAD, Non-toxic, A & O x 3, overweight, looks well HEENT: Atraumatic, Normocephalic. Neck supple. No masses, No LAD.  Bilateral TM wnl, oropharynx normal.  PEERL,EOMI.   Ears and Nose: No external deformity. CV: RRR, No M/G/R. No JVD. No thrill. No extra heart sounds. PULM: CTA B, no wheezes, crackles, rhonchi. No retractions. No resp. distress. No accessory muscle use. ABD: S, NT, ND, +BS. No rebound. No HSM.  EXTR: No c/c/ NEURO Normal gait.  PSYCH: Normally interactive. Conversant. Not depressed or anxious appearing.  Calm demeanor.   Results for orders placed in visit on 08/24/13  POCT INFLUENZA A/B      Result Value Range   Influenza A, POC Negative     Influenza B, POC Negative       Assessment and Plan: Sinus congestion - Plan: POCT Influenza A/B, guaiFENesin (MUCINEX) 600 MG 12 hr tablet, fluticasone (FLONASE) 50 MCG/ACT nasal spray  Cough - Plan: POCT Influenza A/B  Fever, unspecified  Likely viral URI.  Wrote rx for mucinex so he can use his bennycard, and also flonase See patient instructions for more details.     Signed Abbe Amsterdam, MD

## 2013-08-24 NOTE — Patient Instructions (Signed)
It appears that you have a viral URI ( a cold).  Try OTC medications as needed. Ibuprofen and mucinex will likely be most helpful for you.  Let me know if you are not feeling better in the next few days- Sooner if worse.

## 2014-04-29 ENCOUNTER — Ambulatory Visit (INDEPENDENT_AMBULATORY_CARE_PROVIDER_SITE_OTHER): Payer: Managed Care, Other (non HMO) | Admitting: Emergency Medicine

## 2014-04-29 VITALS — BP 128/64 | HR 64 | Temp 97.8°F | Resp 15 | Ht 72.0 in | Wt 250.0 lb

## 2014-04-29 DIAGNOSIS — S139XXA Sprain of joints and ligaments of unspecified parts of neck, initial encounter: Secondary | ICD-10-CM

## 2014-04-29 MED ORDER — ACETAMINOPHEN-CODEINE #3 300-30 MG PO TABS: 1.0000 | ORAL_TABLET | ORAL | Status: DC | PRN

## 2014-04-29 MED ORDER — CYCLOBENZAPRINE HCL 10 MG PO TABS: 10.0000 mg | ORAL_TABLET | Freq: Three times a day (TID) | ORAL | Status: AC | PRN

## 2014-04-29 MED ORDER — NAPROXEN SODIUM 550 MG PO TABS: 550.0000 mg | ORAL_TABLET | Freq: Two times a day (BID) | ORAL | Status: DC

## 2014-04-29 NOTE — Patient Instructions (Signed)

## 2014-04-29 NOTE — Progress Notes (Signed)
[facility] [address] [place] [address] [phone]  Date:  04/29/2014   Name:  Corey Howell.   DOB:  March 02, 1986   MRN:  [id number]  PCP:  No PCP Per Patient    Chief Complaint: Neck Pain   History of Present Illness:  Corey Howell. is a 28 y.o. very pleasant male patient who presents with the following:  Patient has a four day history of pain in the neck.  No history of injury or overuse.  Works in a [employer].   Pain is in base of neck and across shoulders.  Non radiating and no neuro symptoms.   Has been using OTC meds with no relief. No improvement with over the counter medications or other home remedies. Denies other complaint or health concern today.   Patient Active Problem List   Diagnosis Date Noted  . Overweight 08/24/2013    Past Medical History  Diagnosis Date  . Unstable MCL   . Tear of PCL (posterior cruciate ligament) of knee   . ACL injury tear   . H. pylori infection     Past Surgical History  Procedure Laterality Date  . Knee reconstruction Right 2003    Sheryle Spray, ACL, MCL, LCL repair    History  Substance Use Topics  . Smoking status: Never Smoker   . Smokeless tobacco: Never Used  . Alcohol Use: 1.2 oz/week    2 Glasses of wine per week    Family History  Problem Relation Age of Onset  . Diabetes Mother   . Hypertension Father   . Hypertension Maternal Grandmother   . Diabetes Maternal Grandfather     No Known Allergies  Medication list has been reviewed and updated.  Current Outpatient Prescriptions on File Prior to Visit  Medication Sig Dispense Refill  . ibuprofen (ADVIL,MOTRIN) 200 MG tablet Take 200 mg by mouth every 6 (six) hours as needed for pain.      . cyclobenzaprine (FLEXERIL) 10 MG tablet Take one tablet at bedtime as needed as a muscle relaxant  14 tablet  0  . fluticasone (FLONASE) 50 MCG/ACT nasal spray Place 2 sprays into both nostrils daily.  16 g  6  . guaiFENesin  (MUCINEX) 600 MG 12 hr tablet Take 1 tablet (600 mg total) by mouth 2 (two) times daily as needed.  30 tablet  1  . HYDROcodone-acetaminophen (NORCO) 5-325 MG per tablet Take 1 tablet by mouth every 6 (six) hours as needed for pain.  12 tablet  0  . meloxicam (MOBIC) 15 MG tablet Take 1 tablet (15 mg total) by mouth daily.  30 tablet  0   No current facility-administered medications on file prior to visit.    Review of Systems:  I have reviewed the patient's medical history in detail and updated the computerized patient record.   Physical Examination: Filed Vitals:   04/29/14 0841  BP: 128/64  Pulse: 64  Temp: 97.8 F (36.6 C)  Resp: 15   Filed Vitals:   04/29/14 0841  Height: 6' (1.829 m)  Weight: 250 lb (113.399 kg)   Body mass index is 33.9 kg/(m^2). Ideal Body Weight: Weight in (lb) to have BMI = 25: 183.9   GEN: WDWN, NAD, Non-toxic, Alert & Oriented x 3 HEENT: Atraumatic, Normocephalic.  Ears and Nose: No external deformity. EXTR: No clubbing/cyanosis/edema NEURO: Normal gait.  PSYCH: Normally interactive. Conversant. Not depressed or anxious appearing.  Calm demeanor.  NECK:  Tender trapezius   Assessment and Plan: Cervical strain Anaprox Flexeril tyl #3  Signed,  Phillips Odor, MD

## 2014-05-19 ENCOUNTER — Ambulatory Visit (INDEPENDENT_AMBULATORY_CARE_PROVIDER_SITE_OTHER): Payer: Managed Care, Other (non HMO) | Admitting: Physician Assistant

## 2014-05-19 VITALS — BP 124/82 | HR 74 | Temp 98.1°F | Resp 18 | Ht 73.0 in | Wt 254.0 lb

## 2014-05-19 DIAGNOSIS — G44219 Episodic tension-type headache, not intractable: Secondary | ICD-10-CM

## 2014-05-19 DIAGNOSIS — R42 Dizziness and giddiness: Secondary | ICD-10-CM

## 2014-05-19 MED ORDER — MECLIZINE HCL 32 MG PO TABS: 32.0000 mg | ORAL_TABLET | Freq: Three times a day (TID) | ORAL | Status: DC | PRN

## 2014-05-19 NOTE — Patient Instructions (Signed)
Return to clinic if your symptoms fail to improve or worsen. Drink plenty of fluids and get plenty of rest.  Vertigo Vertigo means you feel like you or your surroundings are moving when they are not. Vertigo can be dangerous if it occurs when you are at work, driving, or performing difficult activities.  CAUSES  Vertigo occurs when there is a conflict of signals sent to your brain from the visual and sensory systems in your body. There are many different causes of vertigo, including:  Infections, especially in the inner ear.  A bad reaction to a drug or misuse of alcohol and medicines.  Withdrawal from drugs or alcohol.  Rapidly changing positions, such as lying down or rolling over in bed.  A migraine headache.  Decreased blood flow to the brain.  Increased pressure in the brain from a head injury, infection, tumor, or bleeding. SYMPTOMS  You may feel as though the world is spinning around or you are falling to the ground. Because your balance is upset, vertigo can cause nausea and vomiting. You may have involuntary eye movements (nystagmus). DIAGNOSIS  Vertigo is usually diagnosed by physical exam. If the cause of your vertigo is unknown, your caregiver may perform imaging tests, such as an MRI scan (magnetic resonance imaging). TREATMENT  Most cases of vertigo resolve on their own, without treatment. Depending on the cause, your caregiver may prescribe certain medicines. If your vertigo is related to body position issues, your caregiver may recommend movements or procedures to correct the problem. In rare cases, if your vertigo is caused by certain inner ear problems, you may need surgery. HOME CARE INSTRUCTIONS   Follow your caregiver's instructions.  Avoid driving.  Avoid operating heavy machinery.  Avoid performing any tasks that would be dangerous to you or others during a vertigo episode.  Tell your caregiver if you notice that certain medicines seem to be causing your  vertigo. Some of the medicines used to treat vertigo episodes can actually make them worse in some people. SEEK IMMEDIATE MEDICAL CARE IF:   Your medicines do not relieve your vertigo or are making it worse.  You develop problems with talking, walking, weakness, or using your arms, hands, or legs.  You develop severe headaches.  Your nausea or vomiting continues or gets worse.  You develop visual changes.  A family member notices behavioral changes.  Your condition gets worse. MAKE SURE YOU:  Understand these instructions.  Will watch your condition.  Will get help right away if you are not doing well or get worse. Document Released: 05/14/2005 Document Revised: 10/27/2011 Document Reviewed: 02/20/2011 [facility] Patient Information 2015 [employer] [place]. This information is not intended to replace advice given to you by your health care provider. Make sure you discuss any questions you have with your health care provider.

## 2014-05-19 NOTE — Progress Notes (Signed)
Subjective:    Patient ID: Corey ReddenMark L Sahm Howell., male    DOB: 01/27/1986, 28 y.o.   MRN: [id number]  Chief Complaint  Patient presents with  . Fatigue    yesterday   . Headache    yesterday  . Dizziness    this morning   . Nausea    this morning no vomiting    Headache  Associated symptoms include coughing, dizziness, nausea and a sore throat. Pertinent negatives include no abdominal pain, ear pain, fever, hearing loss, tinnitus or vomiting.  Dizziness Associated symptoms include coughing, fatigue, headaches, nausea and a sore throat. Pertinent negatives include no abdominal pain, arthralgias, congestion, fever, myalgias or vomiting.    This is a 28 year old gentleman who presents today with a 3 day history of headache. Headache is frontal in location and constant in nature. It is relieved with 800 mg ibuprofen. It started when he was moving to a new house and was moving heavy furniture and boxes all day. Last night he took nyquil to sleep due to his headache. This morning he woke up and felt nauseated and dizzy. His bout of dizziness lasted about 30 minutes and he reports he felt the room was moving around him. He sat down and the dizziness improved. He has never had dizziness like this before. He has had a scratchy throat and some loose stools the past 2 days. He does not have any sick contacts.   Review of Systems  Constitutional: Positive for fatigue. Negative for fever.  HENT: Positive for sore throat. Negative for congestion, ear pain, hearing loss and tinnitus.   Eyes: Negative for visual disturbance.  Respiratory: Positive for cough.   Gastrointestinal: Positive for nausea and diarrhea. Negative for vomiting and abdominal pain.  Musculoskeletal: Negative for arthralgias and myalgias.  Neurological: Positive for dizziness and headaches. Negative for syncope.       Objective:   Physical Exam  Constitutional: He is oriented to person, place, and time. He appears  well-developed and well-nourished. No distress.  HENT:  Head: Normocephalic and atraumatic.  Right Ear: External ear normal.  Left Ear: External ear normal.  Mouth/Throat: Oropharynx is clear and moist. No oropharyngeal exudate.  Bilateral tympanic membranes clear  Eyes: Conjunctivae are normal. Right eye exhibits no discharge. Left eye exhibits no discharge.  Neck: Neck supple.  Cardiovascular: Normal rate, regular rhythm and normal heart sounds.   No murmur heard. Pulmonary/Chest: Effort normal and breath sounds normal. No respiratory distress. He has no wheezes. He has no rales.  Abdominal: Soft. There is no tenderness.  Lymphadenopathy:    He has no cervical adenopathy.  Neurological: He is alert and oriented to person, place, and time. He has normal reflexes. No cranial nerve deficit.  Sensation intact throughout. Nystagmus absent.  Skin: Skin is warm and dry. He is not diaphoretic.  Psychiatric: He has a normal mood and affect. His behavior is normal. Thought content normal.       Assessment & Plan:  1. Dizziness 2. Episodic tension-type headache, not intractable  Tension headache has been relieved so far with 800 mg Ibuprofen. Patient may continue to take ibuprofen on as needed basis until headache resolves. Dizziness seems benign in nature. Neurologic exam is normal and no nystagmus is exhibited. He has a job that requires strenuous physical activity and in addition has recently moved and been moving around heavy boxes and furniture. Meclizine was prescribed and has been excused from work today. He was encouraged to stay  hydrated and get plenty of rest. He may return to work tomorrow if feeling better.  - meclizine (ANTIVERT) 32 MG tablet; Take 1 tablet (32 mg total) by mouth 3 (three) times daily as needed.  Dispense: 30 tablet; Refill: 0

## 2014-05-19 NOTE — Progress Notes (Signed)
I have examined this patient along with Ms. Bush, PA-C and agree.  

## 2014-09-26 ENCOUNTER — Ambulatory Visit (INDEPENDENT_AMBULATORY_CARE_PROVIDER_SITE_OTHER): Payer: Managed Care, Other (non HMO) | Admitting: Family Medicine

## 2014-09-26 ENCOUNTER — Ambulatory Visit: Payer: Managed Care, Other (non HMO)

## 2014-09-26 VITALS — BP 120/72 | HR 75 | Temp 98.3°F | Resp 18 | Ht 73.0 in | Wt 265.0 lb

## 2014-09-26 DIAGNOSIS — J988 Other specified respiratory disorders: Secondary | ICD-10-CM

## 2014-09-26 DIAGNOSIS — R05 Cough: Secondary | ICD-10-CM

## 2014-09-26 DIAGNOSIS — J22 Unspecified acute lower respiratory infection: Secondary | ICD-10-CM

## 2014-09-26 DIAGNOSIS — J069 Acute upper respiratory infection, unspecified: Secondary | ICD-10-CM

## 2014-09-26 DIAGNOSIS — R059 Cough, unspecified: Secondary | ICD-10-CM

## 2014-09-26 MED ORDER — AZITHROMYCIN 250 MG PO TABS: ORAL_TABLET | ORAL | Status: AC

## 2014-09-26 NOTE — Progress Notes (Signed)
Subjective:    Patient ID: Corey Redden., male    DOB: May 18, 1986, 29 y.o.   MRN: [id number] This chart was scribed for Meredith Staggers, MD by Littie Deeds, Medical Scribe. This patient was seen in Room 8 and the patient's care was started at 9:42 AM.   HPI HPI Comments: Corey Ricketson. is a 29 y.o. male who presents to the [facility] complaining of gradual onset URI symptoms that started about 1 week ago. Patient reports initially having an non-improving cough and fatigue, then developed sore throat. He also reports having sinus congestion that started over the weekend (2-3 days ago) and voice hoarseness that started this morning. His cough was initially dry, but became productive of green sputum that started yesterday and worsened today. Patient has tried Theraflu and Robitussin for his symptoms. He denies fever, HA, sinus pressure, SOB and wheezing. He also denies sick contacts. Patient picks up boxes for work.   Patient Active Problem List   Diagnosis Date Noted  . Overweight 08/24/2013   Past Medical History  Diagnosis Date  . Unstable MCL   . Tear of PCL (posterior cruciate ligament) of knee   . ACL injury tear   . H. pylori infection    Past Surgical History  Procedure Laterality Date  . Knee reconstruction Right 2003    Sheryle Spray, ACL, MCL, LCL repair   No Known Allergies Prior to Admission medications   Medication Sig Start Date End Date Taking? Authorizing Provider  cyclobenzaprine (FLEXERIL) 10 MG tablet Take 1 tablet (10 mg total) by mouth 3 (three) times daily as needed for muscle spasms. 04/29/14  Yes Carmelina Dane, MD  ibuprofen (ADVIL,MOTRIN) 200 MG tablet Take 200 mg by mouth every 6 (six) hours as needed for pain.   Yes Historical Provider, MD  acetaminophen-codeine (TYLENOL #3) 300-30 MG per tablet Take 1-2 tablets by mouth every 4 (four) hours as needed. Patient not taking: Reported on 09/26/2014 04/29/14   Carmelina Dane,  MD  fluticasone [facility]) 50 MCG/ACT nasal spray Place 2 sprays into both nostrils daily. Patient not taking: Reported on 09/26/2014 08/24/13   Pearline Cables, MD  guaiFENesin (MUCINEX) 600 MG 12 hr tablet Take 1 tablet (600 mg total) by mouth 2 (two) times daily as needed. Patient not taking: Reported on 09/26/2014 08/24/13   Pearline Cables, MD  meclizine (ANTIVERT) 32 MG tablet Take 1 tablet (32 mg total) by mouth 3 (three) times daily as needed. Patient not taking: Reported on 09/26/2014 05/19/14   Dorna Leitz, PA-C   History   Social History  . Marital Status: Married    Spouse Name: Lavonna Rua    Number of Children: 1  . Years of Education: 12   Occupational History  . grocery selector [employer]   Social History Main Topics  . Smoking status: Never Smoker   . Smokeless tobacco: Never Used  . Alcohol Use: 1.2 oz/week    2 Glasses of wine per week  . Drug Use: No  . Sexual Activity:    Partners: Female    Pharmacist, hospital Protection: Condom   Other Topics Concern  . Not on file   Social History Narrative   Lives with his wife and their daughter.     Review of Systems  Constitutional: Positive for fatigue. Negative for fever.  HENT: Positive for congestion, sore throat and voice change. Negative for sinus pressure.   Respiratory: Positive for cough.  Negative for shortness of breath and wheezing.   Neurological: Negative for headaches.       Objective:   Physical Exam  Constitutional: He is oriented to person, place, and time. He appears well-developed and well-nourished.  HENT:  Head: Normocephalic and atraumatic.  Right Ear: Tympanic membrane, external ear and ear canal normal.  Left Ear: Tympanic membrane, external ear and ear canal normal.  Nose: No rhinorrhea.  Mouth/Throat: Mucous membranes are normal. Posterior oropharyngeal erythema present. No oropharyngeal exudate.  Minimal erythema in oropharynx, no exudate.  Eyes: Conjunctivae are normal. Pupils are equal,  round, and reactive to light.  Neck: Neck supple.  Cardiovascular: Normal rate, regular rhythm, normal heart sounds and intact distal pulses.   No murmur heard. Pulmonary/Chest: Effort normal and breath sounds normal. No respiratory distress. He has no wheezes. He has no rhonchi. He has no rales.  Abdominal: Soft. There is no tenderness.  Lymphadenopathy:    He has no cervical adenopathy.  Neurological: He is alert and oriented to person, place, and time.  Skin: Skin is warm and dry. No rash noted.  Psychiatric: He has a normal mood and affect. His behavior is normal.  Vitals reviewed.     Filed Vitals:   09/26/14 0851  BP: 120/72  Pulse: 75  Temp: 98.3 F (36.8 C)  TempSrc: Oral  Resp: 18  Height:  (1.854 m)  Weight: 265 lb (120.203 kg)  SpO2: 97%       Assessment & Plan:   Corey Plancarte. is a 28 y.o. male LRTI (lower respiratory tract infection) - Plan: azithromycin (ZITHROMAX) 250 MG tablet  Cough  Acute upper respiratory infection  Suspected viral URI, with perisistent cough, now productive discolored phlegm. Now at 1 week of sx's.  -sx care with mucinex, saline NS, then if cough not improving in next few days - can fill Zpak. rtc precautions discussed.     Meds ordered this encounter  Medications  . azithromycin (ZITHROMAX) 250 MG tablet    Sig: Take 2 pills by mouth on day 1, then 1 pill by mouth per day on days 2 through 5.    Dispense:  6 tablet    Refill:  0    Patient Instructions  Saline nasal spray atleast 4 times per day for nasal congestion, over the counter mucinex or mucinex DM for cough, drink plenty of fluids.  If cough not improving in next few days - can start antibiotic for possible bronchitis.  Return to the clinic or go to the nearest emergency room if any of your symptoms worsen or new symptoms occur.  Upper Respiratory Infection, Adult An upper respiratory infection (URI) is also sometimes known as the common cold. The upper  respiratory tract includes the nose, sinuses, throat, trachea, and bronchi. Bronchi are the airways leading to the lungs. Most people improve within 1 week, but symptoms can last up to 2 weeks. A residual cough may last even longer.  CAUSES Many different viruses can infect the tissues lining the upper respiratory tract. The tissues become irritated and inflamed and often become very moist. Mucus production is also common. A cold is contagious. You can easily spread the virus to others by oral contact. This includes kissing, sharing a glass, coughing, or sneezing. Touching your mouth or nose and then touching a surface, which is then touched by another person, can also spread the virus. SYMPTOMS  Symptoms typically develop 1 to 3 days after you come in contact with  a cold virus. Symptoms vary from person to person. They may include:  Runny nose.  Sneezing.  Nasal congestion.  Sinus irritation.  Sore throat.  Loss of voice (laryngitis).  Cough.  Fatigue.  Muscle aches.  Loss of appetite.  Headache.  Low-grade fever. DIAGNOSIS  You might diagnose your own cold based on familiar symptoms, since most people get a cold 2 to 3 times a year. Your caregiver can confirm this based on your exam. Most importantly, your caregiver can check that your symptoms are not due to another disease such as strep throat, sinusitis, pneumonia, asthma, or epiglottitis. Blood tests, throat tests, and X-rays are not necessary to diagnose a common cold, but they may sometimes be helpful in excluding other more serious diseases. Your caregiver will decide if any further tests are required. RISKS AND COMPLICATIONS  You may be at risk for a more severe case of the common cold if you smoke cigarettes, have chronic heart disease (such as heart failure) or lung disease (such as asthma), or if you have a weakened immune system. The very young and very old are also at risk for more serious infections. Bacterial  sinusitis, middle ear infections, and bacterial pneumonia can complicate the common cold. The common cold can worsen asthma and chronic obstructive pulmonary disease (COPD). Sometimes, these complications can require emergency medical care and may be life-threatening. PREVENTION  The best way to protect against getting a cold is to practice good hygiene. Avoid oral or hand contact with people with cold symptoms. Wash your hands often if contact occurs. There is no clear evidence that vitamin C, vitamin E, echinacea, or exercise reduces the chance of developing a cold. However, it is always recommended to get plenty of rest and practice good nutrition. TREATMENT  Treatment is directed at relieving symptoms. There is no cure. Antibiotics are not effective, because the infection is caused by a virus, not by bacteria. Treatment may include:  Increased fluid intake. Sports drinks offer valuable electrolytes, sugars, and fluids.  Breathing heated mist or steam (vaporizer or shower).  Eating chicken soup or other clear broths, and maintaining good nutrition.  Getting plenty of rest.  Using gargles or lozenges for comfort.  Controlling fevers with ibuprofen or acetaminophen as directed by your caregiver.  Increasing usage of your inhaler if you have asthma. Zinc gel and zinc lozenges, taken in the first 24 hours of the common cold, can shorten the duration and lessen the severity of symptoms. Pain medicines may help with fever, muscle aches, and throat pain. A variety of non-prescription medicines are available to treat congestion and runny nose. Your caregiver can make recommendations and may suggest nasal or lung inhalers for other symptoms.  HOME CARE INSTRUCTIONS   Only take over-the-counter or prescription medicines for pain, discomfort, or fever as directed by your caregiver.  Use a warm mist humidifier or inhale steam from a shower to increase air moisture. This may keep secretions moist and  make it easier to breathe.  Drink enough water and fluids to keep your urine clear or pale yellow.  Rest as needed.  Return to work when your temperature has returned to normal or as your caregiver advises. You may need to stay home longer to avoid infecting others. You can also use a face mask and careful hand washing to prevent spread of the virus. SEEK MEDICAL CARE IF:   After the first few days, you feel you are getting worse rather than better.  You need your  caregiver's advice about medicines to control symptoms.  You develop chills, worsening shortness of breath, or brown or red sputum. These may be signs of pneumonia.  You develop yellow or brown nasal discharge or pain in the face, especially when you bend forward. These may be signs of sinusitis.  You develop a fever, swollen neck glands, pain with swallowing, or white areas in the back of your throat. These may be signs of strep throat. SEEK IMMEDIATE MEDICAL CARE IF:   You have a fever.  You develop severe or persistent headache, ear pain, sinus pain, or chest pain.  You develop wheezing, a prolonged cough, cough up blood, or have a change in your usual mucus (if you have chronic lung disease).  You develop sore muscles or a stiff neck. Document Released: 01/28/2001 Document Revised: 10/27/2011 Document Reviewed: 11/09/2013 [facility] Patient Information 2015 [place]. This information is not intended to replace advice given to you by your health care provider. Make sure you discuss any questions you have with your health care provider.  Acute Bronchitis Bronchitis is inflammation of the airways that extend from the windpipe into the lungs (bronchi). The inflammation often causes mucus to develop. This leads to a cough, which is the most common symptom of bronchitis.  In acute bronchitis, the condition usually develops suddenly and goes away over time, usually in a couple weeks. Smoking, allergies, and asthma can make  bronchitis worse. Repeated episodes of bronchitis may cause further lung problems.  CAUSES Acute bronchitis is most often caused by the same virus that causes a cold. The virus can spread from person to person (contagious) through coughing, sneezing, and touching contaminated objects. SIGNS AND SYMPTOMS   Cough.   Fever.   Coughing up mucus.   Body aches.   Chest congestion.   Chills.   Shortness of breath.   Sore throat.  DIAGNOSIS  Acute bronchitis is usually diagnosed through a physical exam. Your health care provider will also ask you questions about your medical history. Tests, such as chest X-rays, are sometimes done to rule out other conditions.  TREATMENT  Acute bronchitis usually goes away in a couple weeks. Oftentimes, no medical treatment is necessary. Medicines are sometimes given for relief of fever or cough. Antibiotic medicines are usually not needed but may be prescribed in certain situations. In some cases, an inhaler may be recommended to help reduce shortness of breath and control the cough. A cool mist vaporizer may also be used to help thin bronchial secretions and make it easier to clear the chest.  HOME CARE INSTRUCTIONS  Get plenty of rest.   Drink enough fluids to keep your urine clear or pale yellow (unless you have a medical condition that requires fluid restriction). Increasing fluids may help thin your respiratory secretions (sputum) and reduce chest congestion, and it will prevent dehydration.   Take medicines only as directed by your health care provider.  If you were prescribed an antibiotic medicine, finish it all even if you start to feel better.  Avoid smoking and secondhand smoke. Exposure to cigarette smoke or irritating chemicals will make bronchitis worse. If you are a smoker, consider using nicotine gum or skin patches to help control withdrawal symptoms. Quitting smoking will help your lungs heal faster.   Reduce the chances of  another bout of acute bronchitis by washing your hands frequently, avoiding people with cold symptoms, and trying not to touch your hands to your mouth, nose, or eyes.   Keep all follow-up  visits as directed by your health care provider.  SEEK MEDICAL CARE IF: Your symptoms do not improve after 1 week of treatment.  SEEK IMMEDIATE MEDICAL CARE IF:  You develop an increased fever or chills.   You have chest pain.   You have severe shortness of breath.  You have bloody sputum.   You develop dehydration.  You faint or repeatedly feel like you are going to pass out.  You develop repeated vomiting.  You develop a severe headache. MAKE SURE YOU:   Understand these instructions.  Will watch your condition.  Will get help right away if you are not doing well or get worse. Document Released: 09/11/2004 Document Revised: 12/19/2013 Document Reviewed: 01/25/2013 [facility] Patient Information 2015 [place], [place]. This information is not intended to replace advice given to you by your health care provider. Make sure you discuss any questions you have with your health care provider.     I personally performed the services described in this documentation, which was scribed in my presence. The recorded information has been reviewed and considered, and addended by me as needed.

## 2014-09-26 NOTE — Patient Instructions (Addendum)
Saline nasal spray atleast 4 times per day for nasal congestion, over the counter mucinex or mucinex DM for cough, drink plenty of fluids.  If cough not improving in next few days - can start antibiotic for possible bronchitis.  Return to the clinic or go to the nearest emergency room if any of your symptoms worsen or new symptoms occur.  Upper Respiratory Infection, Adult An upper respiratory infection (URI) is also sometimes known as the common cold. The upper respiratory tract includes the nose, sinuses, throat, trachea, and bronchi. Bronchi are the airways leading to the lungs. Most people improve within 1 week, but symptoms can last up to 2 weeks. A residual cough may last even longer.  CAUSES Many different viruses can infect the tissues lining the upper respiratory tract. The tissues become irritated and inflamed and often become very moist. Mucus production is also common. A cold is contagious. You can easily spread the virus to others by oral contact. This includes kissing, sharing a glass, coughing, or sneezing. Touching your mouth or nose and then touching a surface, which is then touched by another person, can also spread the virus. SYMPTOMS  Symptoms typically develop 1 to 3 days after you come in contact with a cold virus. Symptoms vary from person to person. They may include:  Runny nose.  Sneezing.  Nasal congestion.  Sinus irritation.  Sore throat.  Loss of voice (laryngitis).  Cough.  Fatigue.  Muscle aches.  Loss of appetite.  Headache.  Low-grade fever. DIAGNOSIS  You might diagnose your own cold based on familiar symptoms, since most people get a cold 2 to 3 times a year. Your caregiver can confirm this based on your exam. Most importantly, your caregiver can check that your symptoms are not due to another disease such as strep throat, sinusitis, pneumonia, asthma, or epiglottitis. Blood tests, throat tests, and X-rays are not necessary to diagnose a common cold,  but they may sometimes be helpful in excluding other more serious diseases. Your caregiver will decide if any further tests are required. RISKS AND COMPLICATIONS  You may be at risk for a more severe case of the common cold if you smoke cigarettes, have chronic heart disease (such as heart failure) or lung disease (such as asthma), or if you have a weakened immune system. The very young and very old are also at risk for more serious infections. Bacterial sinusitis, middle ear infections, and bacterial pneumonia can complicate the common cold. The common cold can worsen asthma and chronic obstructive pulmonary disease (COPD). Sometimes, these complications can require emergency medical care and may be life-threatening. PREVENTION  The best way to protect against getting a cold is to practice good hygiene. Avoid oral or hand contact with people with cold symptoms. Wash your hands often if contact occurs. There is no clear evidence that vitamin C, vitamin E, echinacea, or exercise reduces the chance of developing a cold. However, it is always recommended to get plenty of rest and practice good nutrition. TREATMENT  Treatment is directed at relieving symptoms. There is no cure. Antibiotics are not effective, because the infection is caused by a virus, not by bacteria. Treatment may include:  Increased fluid intake. Sports drinks offer valuable electrolytes, sugars, and fluids.  Breathing heated mist or steam (vaporizer or shower).  Eating chicken soup or other clear broths, and maintaining good nutrition.  Getting plenty of rest.  Using gargles or lozenges for comfort.  Controlling fevers with ibuprofen or acetaminophen as directed by  your caregiver.  Increasing usage of your inhaler if you have asthma. Zinc gel and zinc lozenges, taken in the first 24 hours of the common cold, can shorten the duration and lessen the severity of symptoms. Pain medicines may help with fever, muscle aches, and throat  pain. A variety of non-prescription medicines are available to treat congestion and runny nose. Your caregiver can make recommendations and may suggest nasal or lung inhalers for other symptoms.  HOME CARE INSTRUCTIONS   Only take over-the-counter or prescription medicines for pain, discomfort, or fever as directed by your caregiver.  Use a warm mist humidifier or inhale steam from a shower to increase air moisture. This may keep secretions moist and make it easier to breathe.  Drink enough water and fluids to keep your urine clear or pale yellow.  Rest as needed.  Return to work when your temperature has returned to normal or as your caregiver advises. You may need to stay home longer to avoid infecting others. You can also use a face mask and careful hand washing to prevent spread of the virus. SEEK MEDICAL CARE IF:   After the first few days, you feel you are getting worse rather than better.  You need your caregiver's advice about medicines to control symptoms.  You develop chills, worsening shortness of breath, or brown or red sputum. These may be signs of pneumonia.  You develop yellow or brown nasal discharge or pain in the face, especially when you bend forward. These may be signs of sinusitis.  You develop a fever, swollen neck glands, pain with swallowing, or white areas in the back of your throat. These may be signs of strep throat. SEEK IMMEDIATE MEDICAL CARE IF:   You have a fever.  You develop severe or persistent headache, ear pain, sinus pain, or chest pain.  You develop wheezing, a prolonged cough, cough up blood, or have a change in your usual mucus (if you have chronic lung disease).  You develop sore muscles or a stiff neck. Document Released: 01/28/2001 Document Revised: 10/27/2011 Document Reviewed: 11/09/2013 [facility] Patient Information 2015 [place], [place]. This information is not intended to replace advice given to you by your health care provider. Make sure  you discuss any questions you have with your health care provider.  Acute Bronchitis Bronchitis is inflammation of the airways that extend from the windpipe into the lungs (bronchi). The inflammation often causes mucus to develop. This leads to a cough, which is the most common symptom of bronchitis.  In acute bronchitis, the condition usually develops suddenly and goes away over time, usually in a couple weeks. Smoking, allergies, and asthma can make bronchitis worse. Repeated episodes of bronchitis may cause further lung problems.  CAUSES Acute bronchitis is most often caused by the same virus that causes a cold. The virus can spread from person to person (contagious) through coughing, sneezing, and touching contaminated objects. SIGNS AND SYMPTOMS   Cough.   Fever.   Coughing up mucus.   Body aches.   Chest congestion.   Chills.   Shortness of breath.   Sore throat.  DIAGNOSIS  Acute bronchitis is usually diagnosed through a physical exam. Your health care provider will also ask you questions about your medical history. Tests, such as chest X-rays, are sometimes done to rule out other conditions.  TREATMENT  Acute bronchitis usually goes away in a couple weeks. Oftentimes, no medical treatment is necessary. Medicines are sometimes given for relief of fever or cough. Antibiotic medicines  are usually not needed but may be prescribed in certain situations. In some cases, an inhaler may be recommended to help reduce shortness of breath and control the cough. A cool mist vaporizer may also be used to help thin bronchial secretions and make it easier to clear the chest.  HOME CARE INSTRUCTIONS  Get plenty of rest.   Drink enough fluids to keep your urine clear or pale yellow (unless you have a medical condition that requires fluid restriction). Increasing fluids may help thin your respiratory secretions (sputum) and reduce chest congestion, and it will prevent dehydration.    Take medicines only as directed by your health care provider.  If you were prescribed an antibiotic medicine, finish it all even if you start to feel better.  Avoid smoking and secondhand smoke. Exposure to cigarette smoke or irritating chemicals will make bronchitis worse. If you are a smoker, consider using nicotine gum or skin patches to help control withdrawal symptoms. Quitting smoking will help your lungs heal faster.   Reduce the chances of another bout of acute bronchitis by washing your hands frequently, avoiding people with cold symptoms, and trying not to touch your hands to your mouth, nose, or eyes.   Keep all follow-up visits as directed by your health care provider.  SEEK MEDICAL CARE IF: Your symptoms do not improve after 1 week of treatment.  SEEK IMMEDIATE MEDICAL CARE IF:  You develop an increased fever or chills.   You have chest pain.   You have severe shortness of breath.  You have bloody sputum.   You develop dehydration.  You faint or repeatedly feel like you are going to pass out.  You develop repeated vomiting.  You develop a severe headache. MAKE SURE YOU:   Understand these instructions.  Will watch your condition.  Will get help right away if you are not doing well or get worse. Document Released: 09/11/2004 Document Revised: 12/19/2013 Document Reviewed: 01/25/2013 [facility] Patient Information 2015 [place], [place]. This information is not intended to replace advice given to you by your health care provider. Make sure you discuss any questions you have with your health care provider.

## 2014-12-08 IMAGING — CR DG CERVICAL SPINE COMPLETE 4+V
5 series · 5 of 5 positions shown · non-contrast
Comparison: None

CLINICAL DATA: Neck pain.  Symptoms while lifting weights.

CERVICAL SPINE - COMPLETE 4+ VIEW

[lpo]
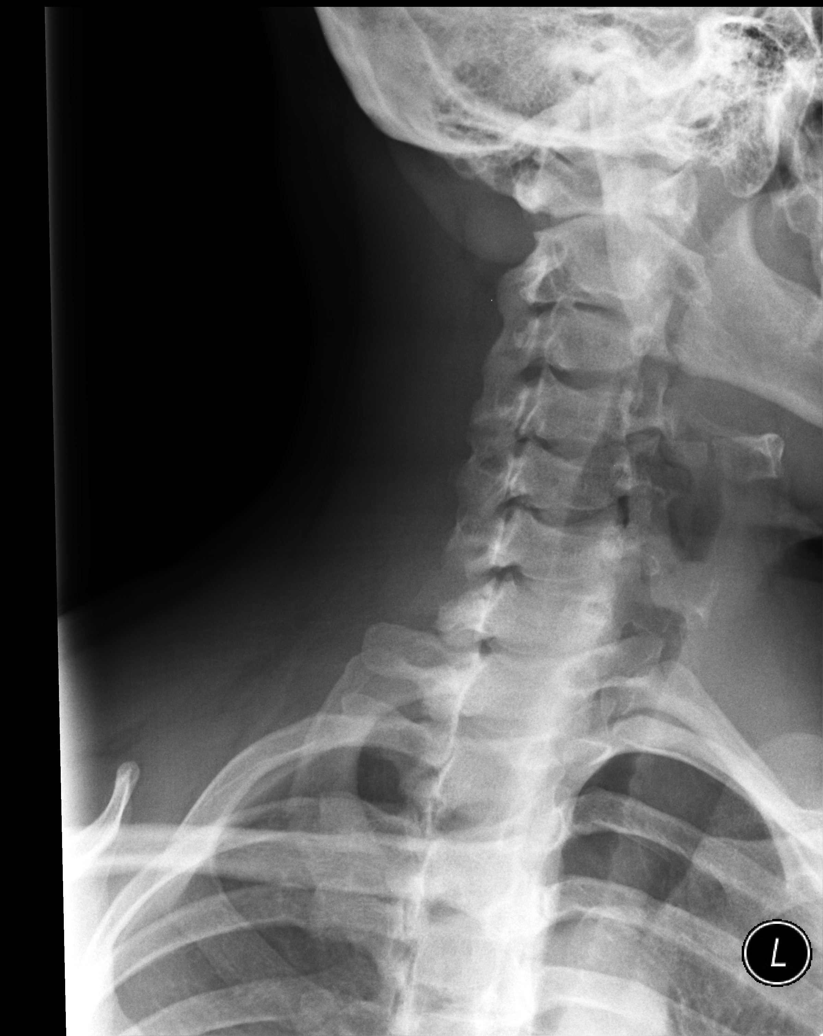

[lateral]
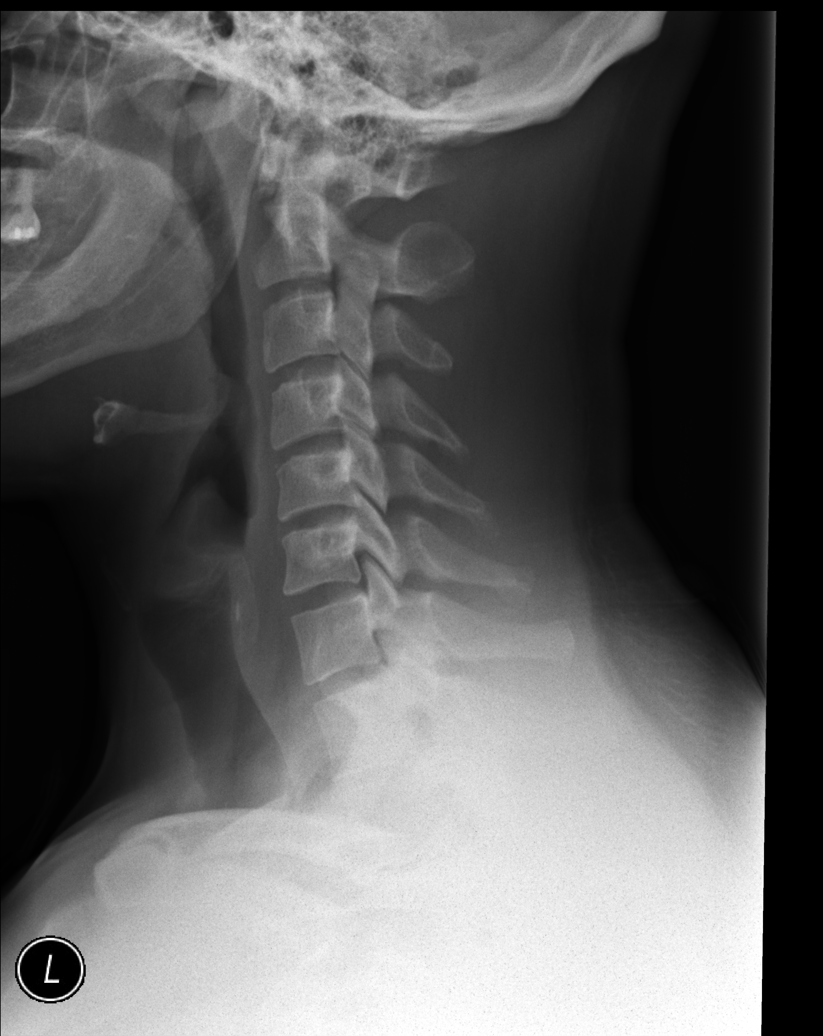

[rpo]
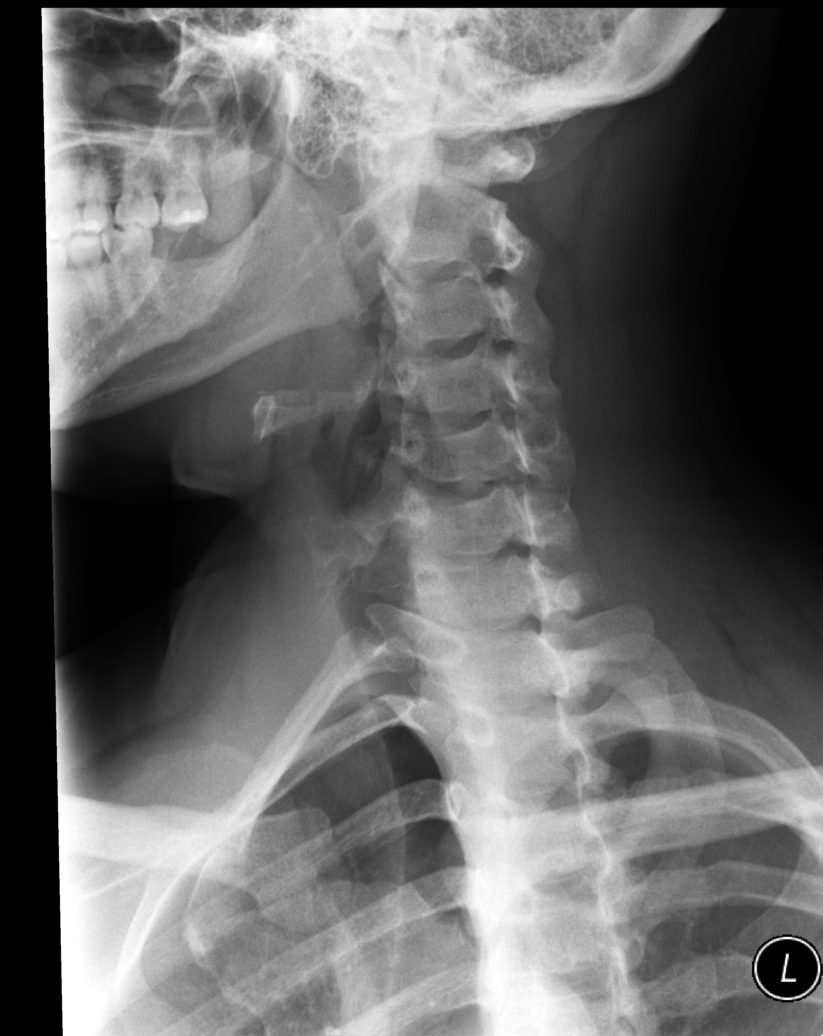

[AP]
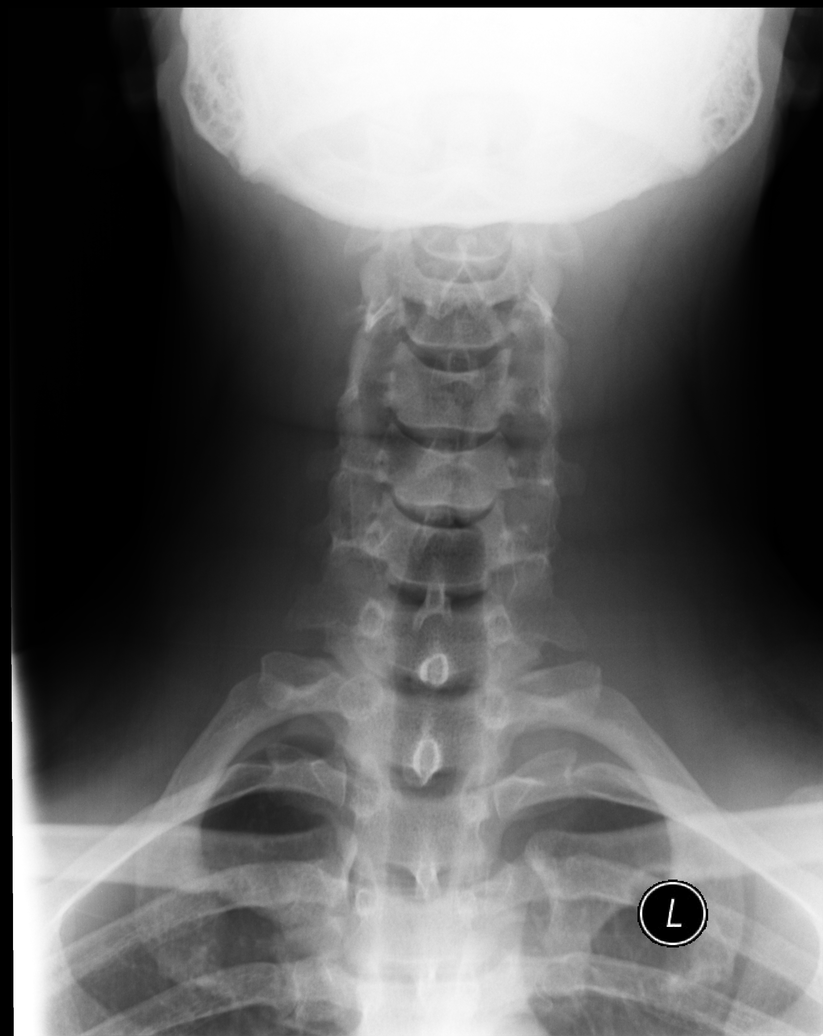

[ap open mouth]
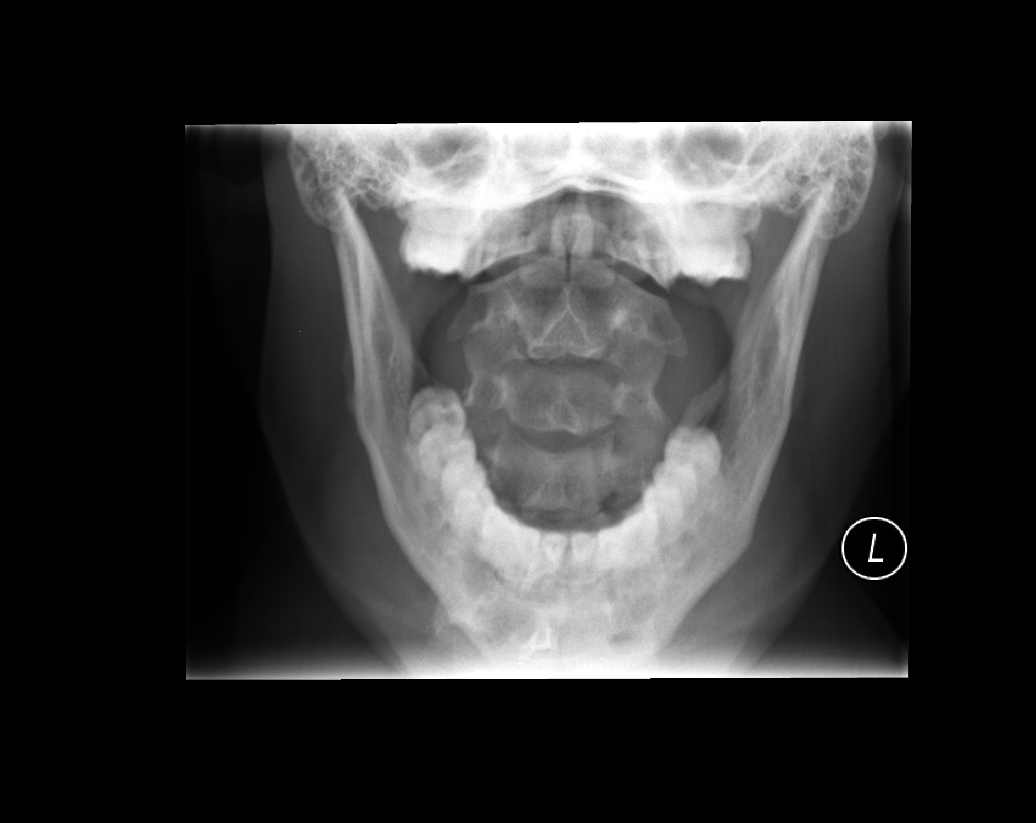

[5 of 5 positions shown; findings below may reference images not displayed]

FINDINGS: There is normal alignment of the cervical spine.  There
is no evidence for acute fracture or dislocation.  Prevertebral
soft tissues have a normal appearance.  Lung apices have a normal
appearance.
IMPRESSION: No evidence for acute  abnormality.

Clinically significant discrepancy from primary report, if
provided: None
